# Patient Record
Sex: Female | Born: 1995
Health system: Southern US, Community
[De-identification: ages and names within clinical notes are randomized; demographics above are authoritative.]

## PROBLEM LIST (undated history)

## (undated) DIAGNOSIS — F32A Depression, unspecified: Secondary | ICD-10-CM

## (undated) DIAGNOSIS — F329 Major depressive disorder, single episode, unspecified: Secondary | ICD-10-CM

## (undated) DIAGNOSIS — F419 Anxiety disorder, unspecified: Secondary | ICD-10-CM

## (undated) HISTORY — DX: Anxiety disorder, unspecified: F41.9

## (undated) HISTORY — DX: Major depressive disorder, single episode, unspecified: F32.9

## (undated) HISTORY — DX: Depression, unspecified: F32.A

---
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
## 2016-11-05 ENCOUNTER — Telehealth: Payer: Self-pay

## 2016-11-05 NOTE — Telephone Encounter (Signed)
Pre visit call attempted. Left message for return call. 

## 2016-11-07 ENCOUNTER — Encounter: Payer: Self-pay | Admitting: Family

## 2016-11-07 ENCOUNTER — Ambulatory Visit (INDEPENDENT_AMBULATORY_CARE_PROVIDER_SITE_OTHER): Payer: 59 | Admitting: Family

## 2016-11-07 DIAGNOSIS — F411 Generalized anxiety disorder: Secondary | ICD-10-CM

## 2016-11-07 MED ORDER — ESCITALOPRAM OXALATE 10 MG PO TABS: ORAL_TABLET | ORAL | 0 refills | Status: DC
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
## 2016-11-07 NOTE — Assessment & Plan Note (Signed)
Uncontrolled.  Will give trial of lexapro 10mg. I instructed pt to start 1/2 tablet once daily for 1 week and then increase to a full tablet once daily on week two as tolerated.  We discussed common side effects such as nausea, drowsiness and weight gain.  Also discussed rare but serious side effect of suicide ideation.  She is instructed to discontinue medication go directly to ED if this occurs.  Pt verbalizes understanding.  Plan follow up in 1 month to evaluate progress.     

## 2016-11-07 NOTE — Patient Instructions (Signed)
Please begin lexapro 1/2 tab once daily then increase to a full tab once daily on week two.

## 2016-11-07 NOTE — Progress Notes (Signed)
Pre visit review using our clinic review tool, if applicable. No additional management support is needed unless otherwise documented below in the visit note. 

## 2016-12-05 ENCOUNTER — Encounter: Payer: Self-pay | Admitting: Family

## 2016-12-05 ENCOUNTER — Ambulatory Visit (INDEPENDENT_AMBULATORY_CARE_PROVIDER_SITE_OTHER): Payer: 59 | Admitting: Family

## 2016-12-05 VITALS — BP 113/68 | HR 54 | Temp 98.6°F | Resp 16 | Ht 60.0 in | Wt 124.4 lb

## 2016-12-05 DIAGNOSIS — F419 Anxiety disorder, unspecified: Secondary | ICD-10-CM | POA: Diagnosis not present

## 2016-12-05 MED ORDER — ESCITALOPRAM OXALATE 10 MG PO TABS: ORAL_TABLET | ORAL | 0 refills | Status: DC

## 2016-12-05 NOTE — Patient Instructions (Signed)
Please continue lexapro. Schedule follow up with a provider in [place] after you move. Good luck!

## 2016-12-05 NOTE — Progress Notes (Signed)
   Subjective:    Patient ID: Amanda Villegas, female    DOB: 17-Apr-1996, 21 y.o.   MRN: [id number]  HPI  Ms. Amanda Villegas is a 21 yr old female who presents today for follow up of her anxiety. Last visit we added lexapro. She reports that her anxiety is much improved since she started lexapro.  Denies any recent panic attacks. Feels better able to let things roll off when things happen at work. Crowds are no longer bothering her.  Denies side effects.     Wt Readings from Last 3 Encounters:  12/05/16 124 lb 6.4 oz (56.4 kg)  11/07/16 125 lb 6.4 oz (56.9 kg)  02/27/13 116 lb (52.6 kg) (42 %, Z= -0.20)*   * Growth percentiles are based on CDC 2-20 Years data.      Review of Systems See HPI  Past Medical History:  Diagnosis Date  . Anxiety   . Depression      Social History   Social History  . Marital status: Single    Spouse name: N/A  . Number of children: N/A  . Years of education: N/A   Occupational History  . Not on file.   Social History Main Topics  . Smoking status: Never Smoker  . Smokeless tobacco: Never Used  . Alcohol use No     Comment: 1 drink a month  . Drug use: No  . Sexual activity: No   Other Topics Concern  . 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  Family History  Problem Relation Age of Onset  . Anxiety disorder Cousin   . Depression Mother   . Anxiety disorder Mother   . Anxiety disorder Sister   . Depression Sister   . Rheum arthritis Sister     Allergies  Allergen Reactions  . Shellfish Allergy Swelling    Current Outpatient Prescriptions on File Prior to Visit  Medication Sig Dispense Refill  . escitalopram (LEXAPRO) 10 MG tablet 1/2 tab by mouth once daily for 1 week, then increase to a full tab on week two 30 tablet 0   No current facility-administered medications on file prior to visit.     BP  113/68 (BP Location: Right Arm, Cuff Size: Normal)   Pulse (!) 54   Temp 98.6 F (37 C) (Oral)   Resp 16   Ht 5' (1.524 m)   Wt 124 lb 6.4 oz (56.4 kg)   LMP 11/21/2016   SpO2 97%   BMI 24.30 kg/m       Objective:   Physical Exam  Constitutional: She is oriented to person, place, and time. She appears well-developed and well-nourished.  Cardiovascular: Normal rate, regular rhythm and normal heart sounds.   No murmur heard. Pulmonary/Chest: Effort normal and breath sounds normal. No respiratory distress. She has no wheezes.  Neurological: She is alert and oriented to person, place, and time.  Psychiatric: She has a normal mood and affect. Her behavior is normal. Judgment and thought content normal.          Assessment & Plan:  Anxiety- improved on lexapro. Continue same. She is moving at the end of the month. She plans to establish with a new pcp when she gets to [place] for ongoing follow up.

## 2016-12-12 DIAGNOSIS — H40033 Anatomical narrow angle, bilateral: Secondary | ICD-10-CM | POA: Diagnosis not present

## 2016-12-12 DIAGNOSIS — G44219 Episodic tension-type headache, not intractable: Secondary | ICD-10-CM | POA: Diagnosis not present

## 2017-02-10 ENCOUNTER — Other Ambulatory Visit: Payer: Self-pay | Admitting: Family

## 2017-02-15 MED ORDER — ESCITALOPRAM OXALATE 10 MG PO TABS: ORAL_TABLET | ORAL | 0 refills | Status: DC

## 2017-04-05 ENCOUNTER — Other Ambulatory Visit: Payer: Self-pay | Admitting: Family

## 2017-07-03 DIAGNOSIS — I499 Cardiac arrhythmia, unspecified: Secondary | ICD-10-CM | POA: Diagnosis not present

## 2017-07-03 DIAGNOSIS — R55 Syncope and collapse: Secondary | ICD-10-CM | POA: Diagnosis not present

## 2017-07-03 DIAGNOSIS — R42 Dizziness and giddiness: Secondary | ICD-10-CM | POA: Diagnosis not present

## 2018-01-07 ENCOUNTER — Ambulatory Visit: Payer: 59 | Admitting: Family

## 2018-01-17 ENCOUNTER — Encounter: Payer: Self-pay | Admitting: Family

## 2018-06-01 ENCOUNTER — Other Ambulatory Visit: Payer: Self-pay

## 2018-06-01 ENCOUNTER — Encounter: Payer: Self-pay | Admitting: Emergency Medicine

## 2018-06-01 ENCOUNTER — Emergency Department
Admission: EM | Admit: 2018-06-01 | Discharge: 2018-06-01 | Disposition: A | Discharge status: Home / Self Care | Payer: 59 | Attending: Emergency Medicine | Admitting: Emergency Medicine

## 2018-06-01 DIAGNOSIS — R1030 Lower abdominal pain, unspecified: Secondary | ICD-10-CM | POA: Diagnosis not present

## 2018-06-01 DIAGNOSIS — Z79899 Other long term (current) drug therapy: Secondary | ICD-10-CM | POA: Diagnosis not present

## 2018-06-01 DIAGNOSIS — R109 Unspecified abdominal pain: Secondary | ICD-10-CM | POA: Diagnosis not present

## 2018-06-01 DIAGNOSIS — R1031 Right lower quadrant pain: Secondary | ICD-10-CM | POA: Diagnosis not present

## 2018-06-01 LAB — CBC
HCT: 39 % (ref 36.0–46.0)
Hemoglobin: 13 g/dL (ref 12.0–15.0)
MCH: 30.4 pg (ref 26.0–34.0)
MCHC: 33.3 g/dL (ref 30.0–36.0)
MCV: 91.3 fL (ref 80.0–100.0)
NRBC: 0 % (ref 0.0–0.2)
PLATELETS: 269 10*3/uL (ref 150–400)
RBC: 4.27 MIL/uL (ref 3.87–5.11)
RDW: 12.2 % (ref 11.5–15.5)
WBC: 5.5 10*3/uL (ref 4.0–10.5)

## 2018-06-01 LAB — URINALYSIS, COMPLETE (UACMP) WITH MICROSCOPIC
BILIRUBIN URINE: NEGATIVE
GLUCOSE, UA: NEGATIVE mg/dL
KETONES UR: NEGATIVE mg/dL
LEUKOCYTES UA: NEGATIVE
NITRITE: NEGATIVE
Protein, ur: NEGATIVE mg/dL
Specific Gravity, Urine: 1.006 (ref 1.005–1.030)
pH: 7 (ref 5.0–8.0)

## 2018-06-01 LAB — COMPREHENSIVE METABOLIC PANEL
ALBUMIN: 4.5 g/dL (ref 3.5–5.0)
ALT: 15 U/L (ref 0–44)
ANION GAP: 7 (ref 5–15)
AST: 19 U/L (ref 15–41)
Alkaline Phosphatase: 56 U/L (ref 38–126)
BUN: 9 mg/dL (ref 6–20)
CALCIUM: 9 mg/dL (ref 8.9–10.3)
CO2: 25 mmol/L (ref 22–32)
Chloride: 106 mmol/L (ref 98–111)
Creatinine, Ser: 0.54 mg/dL (ref 0.44–1.00)
GFR calc non Af Amer: >60 mL/min (ref >60)
Glucose, Bld: 94 mg/dL (ref 70–99)
Potassium: 3.9 mmol/L (ref 3.5–5.1)
SODIUM: 138 mmol/L (ref 135–145)
TOTAL PROTEIN: 7.8 g/dL (ref 6.5–8.1)
Total Bilirubin: 0.7 mg/dL (ref 0.3–1.2)

## 2018-06-01 LAB — LIPASE, BLOOD: LIPASE: 35 U/L (ref 11–51)

## 2018-06-01 MED ORDER — ONDANSETRON 4 MG PO TBDP
4.0000 mg | ORAL_TABLET | Freq: Once | ORAL | Status: AC | PRN
  Administered 2018-06-01: 4 mg via ORAL
  Filled 2018-06-01: qty 1

## 2018-06-01 NOTE — ED Triage Notes (Signed)
FIRST NURSE NOTE-here for abdominal pain. NAD.

## 2018-06-01 NOTE — Discharge Instructions (Addendum)
Return to the ER for new, worsening, persistent severe abdominal pain, vomiting, fever, vaginal bleeding or discharge, urinary symptoms, or any other new or worsening symptoms that concern you.

## 2018-06-01 NOTE — ED Provider Notes (Signed)
[facility] Emergency Department Provider Note ____________________________________________   First MD Initiated Contact with Patient 06/01/18 1533     (approximate)  I have reviewed the triage vital signs and the nursing notes.   HISTORY  Chief Complaint Abdominal Pain    HPI Amanda Villegas is a 22 y.o. female with PMH as noted below who presents with right lower abdominal/suprapubic pain, acute onset about 3 hours ago, and now completely resolved.  The patient reports some mild associated nausea.  She denies fever, vomiting or diarrhea, vaginal bleeding, discharge, or urinary symptoms.  She states that she has had ovarian cysts in the past, and thought that the pain might be due to cyst rupture.   Past Medical History:  Diagnosis Date  . Anxiety   . Depression     Patient Active Problem List   Diagnosis Date Noted  . Generalized anxiety disorder 07/28/2014    History reviewed. No pertinent surgical history.  Prior to Admission medications   Medication Sig Start Date End Date Taking? Authorizing Provider  escitalopram (LEXAPRO) 10 MG tablet TAKE 1 TABLET BY MOUTH EVERY DAY 04/09/17   Sandford Craze, NP    Allergies Shellfish allergy  Family History  Problem Relation Age of Onset  . Anxiety disorder Cousin   . Depression Mother   . Anxiety disorder Mother   . Anxiety disorder Sister   . Depression Sister   . Rheum arthritis Sister     Social History Social History   Tobacco Use  . Smoking status: Never Smoker  . Smokeless tobacco: Never Used  Substance Use Topics  . Alcohol use: No    Comment: 1 drink a month  . Drug use: No    Review of Systems  Constitutional: No fever. Eyes: No redness. ENT: No neck pain. Cardiovascular: Denies chest pain. Respiratory: Denies shortness of breath. Gastrointestinal: Positive for nausea.  Genitourinary: Negative for dysuria.  Musculoskeletal: Negative for back pain. Skin: Negative  for rash. Neurological: Negative for headache.   ____________________________________________   PHYSICAL EXAM:  VITAL SIGNS: ED Triage Vitals  Enc Vitals Group     BP 06/01/18 1500 134/80     Pulse Rate 06/01/18 1500 95     Resp 06/01/18 1500 18     Temp 06/01/18 1500 98.3 F (36.8 C)     Temp src --      SpO2 06/01/18 1500 98 %     Weight 06/01/18 1501 112 lb (50.8 kg)     Height 06/01/18 1501 5\' 4"  (1.626 m)     Head Circumference --      Peak Flow --      Pain Score 06/01/18 1501 5     Pain Loc --      Pain Edu? --      Excl. in GC? --     Constitutional: Alert and oriented. Well appearing and in no acute distress. Eyes: Conjunctivae are normal.  Head: Atraumatic. Nose: No congestion/rhinnorhea. Mouth/Throat: Mucous membranes are moist.   Neck: Normal range of motion.  Cardiovascular: Good peripheral circulation. Respiratory: Normal respiratory effort.  Gastrointestinal: Soft and nontender. No distention.  Genitourinary: No CVA tenderness. Musculoskeletal:  Extremities warm and well perfused.  Neurologic:  Normal speech and language. No gross focal neurologic deficits are appreciated.  Skin:  Skin is warm and dry. No rash noted. Psychiatric: Mood and affect are normal. Speech and behavior are normal.  ____________________________________________   LABS (all labs ordered are listed, but only abnormal  results are displayed)  Labs Reviewed  URINALYSIS, COMPLETE (UACMP) WITH MICROSCOPIC - Abnormal; Notable for the following components:      Result Value   Color, Urine STRAW (*)    APPearance CLEAR (*)    Hgb urine dipstick SMALL (*)    Bacteria, UA RARE (*)    All other components within normal limits  LIPASE, BLOOD  COMPREHENSIVE METABOLIC PANEL  CBC  POC URINE PREG, ED    ____________________________________________  EKG   ____________________________________________  RADIOLOGY    ____________________________________________   PROCEDURES  Procedure(s) performed: No  Procedures  Critical Care performed: No ____________________________________________   INITIAL IMPRESSION / ASSESSMENT AND PLAN / ED COURSE  Pertinent labs & imaging results that were available during my care of the patient were reviewed by me and considered in my medical decision making (see chart for details).  22 year old female presents with acute onset of right lower quadrant/suprapubic area pain about 3 hours prior to arrival, which is now completely resolved.  She reports some associated nausea.  There are no other significant associated symptoms.  On exam the patient is well-appearing and her vital signs are normal.  Her abdomen is soft and there is no tenderness currently.  Overall I suspect most likely ruptured ovarian cyst, mittelschmerz, intestinal cramping, or other benign etiology.  Given the resolved symptoms and lack of tenderness there is no evidence of appendicitis or other concerning cause, or indication for imaging.  We will obtain labs and a UA.  If the patient remains asymptomatic and the work-up is unremarkable anticipate discharge home.  ----------------------------------------- 4:46 PM on 06/01/2018 -----------------------------------------  The labs and UA are unremarkable.  The patient continues to be pain-free.  I counseled her on the likely causes of her symptoms and the results of the work-up.  Return precautions given, and she expresses understanding.   ____________________________________________   FINAL CLINICAL IMPRESSION(S) / ED DIAGNOSES  Final diagnoses:  Lower abdominal pain      NEW MEDICATIONS STARTED DURING THIS VISIT:  New Prescriptions   No medications on file     Note:  This document was prepared using Dragon voice  recognition software and may include unintentional dictation errors.    Dionne BucySiadecki, Janvi Ammar, MD 06/01/18 [id number]

## 2018-06-01 NOTE — ED Notes (Signed)
Pt to the er for lower abd pain that felt like menstrual cramps but was increased on the RLQ. Pt states she has a hx of ovarian cysts. Pt states that the pain became worse and then improved. Pt reports some nausea at this time. No acute distress, or vomiting noted,.

## 2018-06-01 NOTE — ED Triage Notes (Signed)
Pt arrives with complaints of right lower quadrant pain. Pt states the pain started Friday "but wasn't bad." Pt states the pain has increased in intensity over the last few days. Pt states she has a HX of ovarian cysts and attributed the pain to a cyst rupturing. Pt's abdomen tender upon palpation.

## 2018-06-03 ENCOUNTER — Other Ambulatory Visit: Payer: Self-pay

## 2018-06-03 ENCOUNTER — Encounter (HOSPITAL_BASED_OUTPATIENT_CLINIC_OR_DEPARTMENT_OTHER): Payer: Self-pay | Admitting: *Deleted

## 2018-06-03 ENCOUNTER — Emergency Department (HOSPITAL_BASED_OUTPATIENT_CLINIC_OR_DEPARTMENT_OTHER)
Admission: EM | Admit: 2018-06-03 | Discharge: 2018-06-03 | Disposition: A | Discharge status: Home / Self Care | Payer: 59 | Attending: Emergency Medicine | Admitting: Emergency Medicine

## 2018-06-03 ENCOUNTER — Emergency Department (HOSPITAL_BASED_OUTPATIENT_CLINIC_OR_DEPARTMENT_OTHER): Payer: 59

## 2018-06-03 DIAGNOSIS — Z79899 Other long term (current) drug therapy: Secondary | ICD-10-CM | POA: Insufficient documentation

## 2018-06-03 DIAGNOSIS — N83291 Other ovarian cyst, right side: Secondary | ICD-10-CM | POA: Diagnosis not present

## 2018-06-03 DIAGNOSIS — N730 Acute parametritis and pelvic cellulitis: Secondary | ICD-10-CM | POA: Diagnosis not present

## 2018-06-03 DIAGNOSIS — R1030 Lower abdominal pain, unspecified: Secondary | ICD-10-CM | POA: Diagnosis present

## 2018-06-03 DIAGNOSIS — N83201 Unspecified ovarian cyst, right side: Secondary | ICD-10-CM | POA: Diagnosis not present

## 2018-06-03 LAB — COMPREHENSIVE METABOLIC PANEL
ALK PHOS: 56 U/L (ref 38–126)
ALT: 21 U/L (ref 0–44)
ANION GAP: 6 (ref 5–15)
AST: 20 U/L (ref 15–41)
Albumin: 4.7 g/dL (ref 3.5–5.0)
BUN: 9 mg/dL (ref 6–20)
CALCIUM: 9.5 mg/dL (ref 8.9–10.3)
CO2: 25 mmol/L (ref 22–32)
Chloride: 106 mmol/L (ref 98–111)
Creatinine, Ser: 0.74 mg/dL (ref 0.44–1.00)
GFR calc Af Amer: >60 mL/min (ref >60)
GFR calc non Af Amer: >60 mL/min (ref >60)
Glucose, Bld: 91 mg/dL (ref 70–99)
Potassium: 3.9 mmol/L (ref 3.5–5.1)
Sodium: 137 mmol/L (ref 135–145)
Total Bilirubin: 0.7 mg/dL (ref 0.3–1.2)
Total Protein: 8 g/dL (ref 6.5–8.1)

## 2018-06-03 LAB — CBC WITH DIFFERENTIAL/PLATELET
Abs Immature Granulocytes: 0.02 10*3/uL (ref 0.00–0.07)
BASOS ABS: 0 10*3/uL (ref 0.0–0.1)
Basophils Relative: 0 %
EOS PCT: 3 %
Eosinophils Absolute: 0.2 10*3/uL (ref 0.0–0.5)
HEMATOCRIT: 40.4 % (ref 36.0–46.0)
Hemoglobin: 13.1 g/dL (ref 12.0–15.0)
Immature Granulocytes: 0 %
Lymphocytes Relative: 26 %
Lymphs Abs: 1.6 10*3/uL (ref 0.7–4.0)
MCH: 29.8 pg (ref 26.0–34.0)
MCHC: 32.4 g/dL (ref 30.0–36.0)
MCV: 92 fL (ref 80.0–100.0)
Monocytes Absolute: 0.3 10*3/uL (ref 0.1–1.0)
Monocytes Relative: 5 %
Neutro Abs: 4 10*3/uL (ref 1.7–7.7)
Neutrophils Relative %: 66 %
Platelets: 275 10*3/uL (ref 150–400)
RBC: 4.39 MIL/uL (ref 3.87–5.11)
RDW: 12.1 % (ref 11.5–15.5)
WBC: 6.1 10*3/uL (ref 4.0–10.5)
nRBC: 0 % (ref 0.0–0.2)

## 2018-06-03 LAB — URINALYSIS, ROUTINE W REFLEX MICROSCOPIC
BILIRUBIN URINE: NEGATIVE
Glucose, UA: NEGATIVE mg/dL
Ketones, ur: NEGATIVE mg/dL
Leukocytes, UA: NEGATIVE
Nitrite: NEGATIVE
Protein, ur: NEGATIVE mg/dL
Specific Gravity, Urine: 1.025 (ref 1.005–1.030)
pH: 6 (ref 5.0–8.0)

## 2018-06-03 LAB — WET PREP, GENITAL
Sperm: NONE SEEN
Trich, Wet Prep: NONE SEEN
Yeast Wet Prep HPF POC: NONE SEEN

## 2018-06-03 LAB — LIPASE, BLOOD: Lipase: 34 U/L (ref 11–51)

## 2018-06-03 LAB — URINALYSIS, MICROSCOPIC (REFLEX)

## 2018-06-03 LAB — PREGNANCY, URINE: PREG TEST UR: NEGATIVE

## 2018-06-03 MED ORDER — SODIUM CHLORIDE 0.9 % IV SOLN
2.0000 g | Freq: Once | INTRAVENOUS | Status: AC
  Administered 2018-06-03: 2 g via INTRAVENOUS
  Filled 2018-06-03: qty 20

## 2018-06-03 MED ORDER — HYDROCODONE-ACETAMINOPHEN 5-325 MG PO TABS: 1.0000 | ORAL_TABLET | Freq: Four times a day (QID) | ORAL | 0 refills | Status: DC | PRN

## 2018-06-03 MED ORDER — ONDANSETRON HCL 4 MG/2ML IJ SOLN
4.0000 mg | Freq: Once | INTRAMUSCULAR | Status: AC
  Administered 2018-06-03: 4 mg via INTRAVENOUS
  Filled 2018-06-03: qty 2

## 2018-06-03 MED ORDER — IOPAMIDOL (ISOVUE-300) INJECTION 61%
100.0000 mL | Freq: Once | INTRAVENOUS | Status: AC | PRN
  Administered 2018-06-03: 100 mL via INTRAVENOUS

## 2018-06-03 MED ORDER — DOXYCYCLINE HYCLATE 100 MG PO CAPS: 100.0000 mg | ORAL_CAPSULE | Freq: Two times a day (BID) | ORAL | 0 refills | Status: AC

## 2018-06-03 MED ORDER — HYDROMORPHONE HCL 1 MG/ML IJ SOLN
0.5000 mg | Freq: Once | INTRAMUSCULAR | Status: AC
  Administered 2018-06-03: 0.5 mg via INTRAVENOUS
  Filled 2018-06-03: qty 1

## 2018-06-03 MED ORDER — SODIUM CHLORIDE 0.9 % IV SOLN
INTRAVENOUS | Status: DC
  Administered 2018-06-03: 14:00:00 via INTRAVENOUS

## 2018-06-03 MED ORDER — METRONIDAZOLE 500 MG PO TABS: 500.0000 mg | ORAL_TABLET | Freq: Two times a day (BID) | ORAL | 0 refills | Status: AC

## 2018-06-03 MED ORDER — NAPROXEN 500 MG PO TABS: 500.0000 mg | ORAL_TABLET | Freq: Two times a day (BID) | ORAL | 0 refills | Status: DC

## 2018-06-03 MED FILL — NAPROXEN 500 MG TABLET: 500 | 7 days supply | Qty: 14 | Fill #0

## 2018-06-03 MED FILL — metroNIDAZOLE 500 MG TABS: 500 | 14 days supply | Qty: 28 | Fill #0

## 2018-06-03 MED FILL — HYDROCODON-APAP 5-325: 5-325 | 2 days supply | Qty: 10 | Fill #0

## 2018-06-03 MED FILL — DOXYCYCLINE HYCLATE 100 MG: 100 | 14 days supply | Qty: 28 | Fill #0

## 2018-06-03 NOTE — Discharge Instructions (Addendum)
Take the antibiotic Flagyl and doxycycline as directed.  Would expect improvement over the next few days.  Take the Naprosyn and the hydrocodone for the pain.  Return for any new or worse symptoms persistent vomiting high fevers.  Make an appointment to follow-up with your primary care doctor.

## 2018-06-03 NOTE — ED Provider Notes (Signed)
[facility] EMERGENCY DEPARTMENT Provider Note   CSN: [id number] Arrival date & time: 06/03/18  1214     History   Chief Complaint Chief Complaint  Patient presents with  . Abdominal Pain    HPI Amanda Villegas is a 22 y.o. female.  Patient seen at the ED at [facility] onset on December 1.  Patient was evaluated there for lower abdominal pain that was mostly right lower quadrant.  She also had abdominal cramping.  Menstrual period was about a week ago.  These cramps were similar to her menstrual cramps however she does not normally have that happen without bleeding.  Patient improved there did not have any imaging done.  And was discharged home with a clinical diagnosis of an ovarian cyst.  Patient returns today with recurrent right lower quadrant abdominal pain that came back later on Monday.  And patient feels as if she is no better and perhaps even worse.     Past Medical History:  Diagnosis Date  . Anxiety   . Depression     Patient Active Problem List   Diagnosis Date Noted  . Generalized anxiety disorder 07/28/2014    History reviewed. No pertinent surgical history.   OB History   None      Home Medications    Prior to Admission medications   Medication Sig Start Date End Date Taking? Authorizing Provider  doxycycline (VIBRAMYCIN) 100 MG capsule Take 1 capsule (100 mg total) by mouth 2 (two) times daily for 14 days. 06/03/18 06/17/18  Vanetta Mulders, MD  escitalopram (LEXAPRO) 10 MG tablet TAKE 1 TABLET BY MOUTH EVERY DAY 04/09/17   Sandford Craze, NP  HYDROcodone-acetaminophen (NORCO/VICODIN) 5-325 MG tablet Take 1-2 tablets by mouth every 6 (six) hours as needed for moderate pain. 06/03/18   Vanetta Mulders, MD  metroNIDAZOLE (FLAGYL) 500 MG tablet Take 1 tablet (500 mg total) by mouth 2 (two) times daily for 14 days. 06/03/18 06/17/18  Vanetta Mulders, MD  naproxen (NAPROSYN) 500 MG tablet Take 1 tablet (500 mg total) by mouth 2 (two) times  daily. 06/03/18   Vanetta Mulders, MD    Family History Family History  Problem Relation Age of Onset  . Anxiety disorder Cousin   . Depression Mother   . Anxiety disorder Mother   . Anxiety disorder Sister   . Depression Sister   . Rheum arthritis Sister     Social History Social History   Tobacco Use  . Smoking status: Never Smoker  . Smokeless tobacco: Never Used  Substance Use Topics  . Alcohol use: No    Comment: 1 drink a month  . Drug use: No     Allergies   Shellfish allergy   Review of Systems Review of Systems  Constitutional: Negative for fever.  HENT: Negative for congestion.   Eyes: Negative for redness.  Respiratory: Negative for shortness of breath.   Cardiovascular: Negative for chest pain.  Gastrointestinal: Positive for abdominal pain and nausea. Negative for diarrhea and vomiting.  Genitourinary: Positive for vaginal discharge. Negative for dysuria and vaginal bleeding.  Musculoskeletal: Negative for back pain.  Skin: Negative for rash.  Neurological: Negative for headaches.  Hematological: Does not bruise/bleed easily.  Psychiatric/Behavioral: Negative for confusion.     Physical Exam Updated Vital Signs BP (!) 103/56 (BP Location: Right Arm)   Pulse 78   Temp 98.3 F (36.8 C) (Oral)   Resp 16   Ht 1.626 m (5\' 4" )   Wt 50.8 kg  LMP 05/23/2018   SpO2 100%   BMI 19.22 kg/m   Physical Exam  Constitutional: She is oriented to person, place, and time. She appears well-developed and well-nourished. No distress.  HENT:  Head: Normocephalic and atraumatic.  Mouth/Throat: Oropharynx is clear and moist.  Eyes: Pupils are equal, round, and reactive to light. Conjunctivae and EOM are normal.  Neck: Normal range of motion. Neck supple.  Cardiovascular: Normal rate, regular rhythm and normal heart sounds.  Pulmonary/Chest: Effort normal and breath sounds normal.  Abdominal: Soft. Bowel sounds are normal.  Genitourinary: Vaginal discharge  found.  Genitourinary Comments: Normal external genitalia.  Whitish-brown vaginal discharge positive cervical motion tenderness positive urine tenderness positive right adnexal area tenderness.  Musculoskeletal: Normal range of motion. She exhibits no edema.  Neurological: She is alert and oriented to person, place, and time. No cranial nerve deficit or sensory deficit. She exhibits normal muscle tone. Coordination normal.  Skin: Skin is warm. No rash noted.  Nursing note and vitals reviewed.    ED Treatments / Results  Labs (all labs ordered are listed, but only abnormal results are displayed) Labs Reviewed  WET PREP, GENITAL - Abnormal; Notable for the following components:      Result Value   Clue Cells Wet Prep HPF POC PRESENT (*)    WBC, Wet Prep HPF POC MANY (*)    All other components within normal limits  URINALYSIS, ROUTINE W REFLEX MICROSCOPIC - Abnormal; Notable for the following components:   Hgb urine dipstick SMALL (*)    All other components within normal limits  URINALYSIS, MICROSCOPIC (REFLEX) - Abnormal; Notable for the following components:   Bacteria, UA FEW (*)    All other components within normal limits  PREGNANCY, URINE  COMPREHENSIVE METABOLIC PANEL  LIPASE, BLOOD  CBC WITH DIFFERENTIAL/PLATELET  RPR  HIV ANTIBODY (ROUTINE TESTING W REFLEX)  GC/CHLAMYDIA PROBE AMP ([place]) NOT AT [facility]    EKG None  Radiology Ct Abdomen Pelvis W Contrast  Result Date: 06/03/2018 CLINICAL DATA:  Acute right lower quadrant abdominal pain. EXAM: CT ABDOMEN AND PELVIS WITH CONTRAST TECHNIQUE: Multidetector CT imaging of the abdomen and pelvis was performed using the standard protocol following bolus administration of intravenous contrast. CONTRAST:  ISOVUE-300 IOPAMIDOL (ISOVUE-300) INJECTION 61% COMPARISON:  None. FINDINGS: Lower chest: No acute abnormality. Hepatobiliary: No focal liver abnormality is seen. No gallstones, gallbladder wall thickening, or biliary  dilatation. Pancreas: Unremarkable. No pancreatic ductal dilatation or surrounding inflammatory changes. Spleen: Normal in size without focal abnormality. Adrenals/Urinary Tract: Adrenal glands are unremarkable. Kidneys are normal, without renal calculi, focal lesion, or hydronephrosis. Bladder is unremarkable. Stomach/Bowel: Stomach is within normal limits. Appendix appears normal. No evidence of bowel wall thickening, distention, or inflammatory changes. Vascular/Lymphatic: No significant vascular findings are present. No enlarged abdominal or pelvic lymph nodes. Reproductive: Uterus and left adnexal region are unremarkable. 2.2 cm right ovarian cyst is noted. Small amount of free fluid is noted in the right adnexal region which may represent physiologic fluid or possibly be secondary to ovarian cyst rupture. Other: No significant hernia is noted. Musculoskeletal: No acute or significant osseous findings. IMPRESSION: 2.2 cm right ovarian cyst is noted. Small amount of fluid is noted in the right adnexal region which may represent physiologic fluid or possibly be secondary to ovarian cyst rupture. Pelvic ultrasound may be performed further evaluation given the history of right lower quadrant pain. No other abnormality seen in the abdomen or pelvis. Electronically Signed   By: Roque Lias  Montez HagemanJr, M.D.   On: 06/03/2018 14:54    Procedures Procedures (including critical care time)  Medications Ordered in ED Medications  0.9 %  sodium chloride infusion ( Intravenous New Bag/Given 06/03/18 1423)  cefTRIAXone (ROCEPHIN) 2 g in sodium chloride 0.9 % 100 mL IVPB (2 g Intravenous New Bag/Given 06/03/18 1546)  ondansetron (ZOFRAN) injection 4 mg (4 mg Intravenous Given 06/03/18 1424)  HYDROmorphone (DILAUDID) injection 0.5 mg (0.5 mg Intravenous Given 06/03/18 1423)  iopamidol (ISOVUE-300) 61 % injection 100 mL (100 mLs Intravenous Contrast Given 06/03/18 1434)     Initial Impression / Assessment and Plan / ED Course   I have reviewed the triage vital signs and the nursing notes.  Pertinent labs & imaging results that were available during my care of the patient were reviewed by me and considered in my medical decision making (see chart for details).     CT scan shows evidence of a right ovarian cyst.  The patient had vaginal discharge so pelvic was done.  More consistent with PID.  Patient received 2 g of Rocephin here will be continued on a 2-week course of doxycycline and Flagyl.  Formal cultures pending.  Patient will also be treated with Naprosyn and hydrocodone for the pain.  Patient is no longer taking Lexapro so no contraindication to taking Naprosyn.  Pregnancy test was negative.  Wet prep  finger shows a few clue cells.    Final Clinical Impressions(s) / ED Diagnoses   Final diagnoses:  Cyst of right ovary  PID (acute pelvic inflammatory disease)    ED Discharge Orders         Ordered    doxycycline (VIBRAMYCIN) 100 MG capsule  2 times daily     06/03/18 1611    metroNIDAZOLE (FLAGYL) 500 MG tablet  2 times daily     06/03/18 1611    naproxen (NAPROSYN) 500 MG tablet  2 times daily     06/03/18 1611    HYDROcodone-acetaminophen (NORCO/VICODIN) 5-325 MG tablet  Every 6 hours PRN     06/03/18 1611           Vanetta MuldersZackowski, Ivey Cina, MD 06/03/18 1620

## 2018-06-03 NOTE — ED Triage Notes (Signed)
Right lower quadrant pain since Monday. She was seen at [facility] ED for same pain 2 days ago for ovarian cyst pain. Pain is no better.

## 2018-06-03 NOTE — ED Notes (Signed)
ED Provider at bedside. 

## 2018-06-04 LAB — RPR: RPR Ser Ql: NONREACTIVE

## 2018-06-04 LAB — GC/CHLAMYDIA PROBE AMP (~~LOC~~) NOT AT ARMC
Chlamydia: NEGATIVE
Neisseria Gonorrhea: NEGATIVE

## 2018-06-04 LAB — HIV ANTIBODY (ROUTINE TESTING W REFLEX): HIV Screen 4th Generation wRfx: NONREACTIVE

## 2018-12-01 ENCOUNTER — Inpatient Hospital Stay: Admission: RE | Admit: 2018-12-01 | Payer: 59 | Source: Ambulatory Visit

## 2019-02-09 ENCOUNTER — Emergency Department (HOSPITAL_BASED_OUTPATIENT_CLINIC_OR_DEPARTMENT_OTHER)
Admission: EM | Admit: 2019-02-09 | Discharge: 2019-02-09 | Disposition: A | Discharge status: Home / Self Care | Attending: Emergency Medicine | Admitting: Emergency Medicine

## 2019-02-09 ENCOUNTER — Emergency Department (HOSPITAL_BASED_OUTPATIENT_CLINIC_OR_DEPARTMENT_OTHER)

## 2019-02-09 ENCOUNTER — Encounter (HOSPITAL_BASED_OUTPATIENT_CLINIC_OR_DEPARTMENT_OTHER): Payer: Self-pay | Admitting: *Deleted

## 2019-02-09 ENCOUNTER — Other Ambulatory Visit: Payer: Self-pay

## 2019-02-09 DIAGNOSIS — S0990XA Unspecified injury of head, initial encounter: Secondary | ICD-10-CM | POA: Diagnosis present

## 2019-02-09 DIAGNOSIS — S060X0A Concussion without loss of consciousness, initial encounter: Secondary | ICD-10-CM | POA: Diagnosis not present

## 2019-02-09 DIAGNOSIS — Y939 Activity, unspecified: Secondary | ICD-10-CM | POA: Insufficient documentation

## 2019-02-09 DIAGNOSIS — Y990 Civilian activity done for income or pay: Secondary | ICD-10-CM | POA: Insufficient documentation

## 2019-02-09 DIAGNOSIS — Z79899 Other long term (current) drug therapy: Secondary | ICD-10-CM | POA: Insufficient documentation

## 2019-02-09 DIAGNOSIS — Z791 Long term (current) use of non-steroidal anti-inflammatories (NSAID): Secondary | ICD-10-CM | POA: Insufficient documentation

## 2019-02-09 DIAGNOSIS — W228XXA Striking against or struck by other objects, initial encounter: Secondary | ICD-10-CM | POA: Insufficient documentation

## 2019-02-09 DIAGNOSIS — Y9259 Other trade areas as the place of occurrence of the external cause: Secondary | ICD-10-CM | POA: Insufficient documentation

## 2019-02-09 MED ORDER — ONDANSETRON HCL 4 MG PO TABS: 4.0000 mg | ORAL_TABLET | Freq: Four times a day (QID) | ORAL | 0 refills | Status: DC

## 2019-02-09 NOTE — Discharge Instructions (Signed)
Take Zofran every 6 hours as needed for nausea or vomiting.  Take ibuprofen or Tylenol as prescribed at the counter, as needed for headache.  Please follow-up with your doctor or Dr. Tamala Julian as needed if your symptoms are not improving.  Try to avoid focused activities such as looking at your phone, tablet, computer.  Try to get plenty of rest and drink plenty of fluids.  Please return to the emergency department if he develop any new or worsening symptoms

## 2019-02-09 NOTE — ED Provider Notes (Signed)
[facility] EMERGENCY DEPARTMENT Provider Note   CSN: [id number] Arrival date & time: 02/09/19  1553    History   Chief Complaint Chief Complaint  Patient presents with  . Head Injury    HPI Amanda Villegas is a 23 y.o. female with history of anxiety, depression who presents for evaluation following head trauma.  Patient reports she was at work when she was hit by the bathroom door.  She reports her vision went a little black, but she did not lose consciousness.  She vomited once about 10 minutes later.  She has persistent headache and some mild nausea.  She was seen at urgent care and was sent here for CT.  She denies any vision changes, recurrent vomiting, numbness or tingling.     HPI  Past Medical History:  Diagnosis Date  . Anxiety   . Depression     Patient Active Problem List   Diagnosis Date Noted  . Generalized anxiety disorder 07/28/2014    History reviewed. No pertinent surgical history.   OB History   No obstetric history on file.      Home Medications    Prior to Admission medications   Medication Sig Start Date End Date Taking? Authorizing Provider  escitalopram (LEXAPRO) 10 MG tablet TAKE 1 TABLET BY MOUTH EVERY DAY 04/09/17   Sandford Craze'Sullivan, Melissa, NP  HYDROcodone-acetaminophen (NORCO/VICODIN) 5-325 MG tablet Take 1-2 tablets by mouth every 6 (six) hours as needed for moderate pain. 06/03/18   Vanetta MuldersZackowski, Scott, MD  naproxen (NAPROSYN) 500 MG tablet Take 1 tablet (500 mg total) by mouth 2 (two) times daily. 06/03/18   Vanetta MuldersZackowski, Scott, MD  ondansetron (ZOFRAN) 4 MG tablet Take 1 tablet (4 mg total) by mouth every 6 (six) hours. 02/09/19   Emi HolesLaw, Sarahbeth Cashin M, PA-C    Family History Family History  Problem Relation Age of Onset  . Anxiety disorder Cousin   . Depression Mother   . Anxiety disorder Mother   . Anxiety disorder Sister   . Depression Sister   . Rheum arthritis Sister     Social History Social History   Tobacco Use  . Smoking  status: Never Smoker  . Smokeless tobacco: Never Used  Substance Use Topics  . Alcohol use: No    Comment: 1 drink a month  . Drug use: No     Allergies   Shellfish allergy   Review of Systems Review of Systems  Constitutional: Negative for chills and fever.  HENT: Negative for facial swelling and sore throat.   Eyes: Negative for visual disturbance.  Respiratory: Negative for shortness of breath.   Cardiovascular: Negative for chest pain.  Gastrointestinal: Negative for abdominal pain, nausea and vomiting.  Genitourinary: Negative for dysuria.  Musculoskeletal: Negative for back pain.  Skin: Negative for rash and wound.  Neurological: Positive for headaches. Negative for syncope.  Psychiatric/Behavioral: The patient is not nervous/anxious.      Physical Exam Updated Vital Signs BP 107/72 (BP Location: Right Arm)   Pulse 77   Temp 98.7 F (37.1 C) (Oral)   Resp 14   Ht 5\' 3"  (1.6 m)   Wt 55.3 kg   LMP 01/29/2019 (Approximate)   SpO2 100%   BMI 21.61 kg/m   Physical Exam Vitals signs and nursing note reviewed.  Constitutional:      General: She is not in acute distress.    Appearance: She is well-developed. She is not diaphoretic.  HENT:     Head: Normocephalic and atraumatic.  Right Ear: Tympanic membrane normal.     Left Ear: Tympanic membrane normal.     Mouth/Throat:     Pharynx: No oropharyngeal exudate.  Eyes:     General: No scleral icterus.       Right eye: No discharge.        Left eye: No discharge.     Extraocular Movements: Extraocular movements intact.     Conjunctiva/sclera: Conjunctivae normal.     Pupils: Pupils are equal, round, and reactive to light.  Neck:     Musculoskeletal: Normal range of motion and neck supple.     Thyroid: No thyromegaly.  Cardiovascular:     Rate and Rhythm: Normal rate and regular rhythm.     Heart sounds: Normal heart sounds. No murmur. No friction rub. No gallop.   Pulmonary:     Effort: Pulmonary  effort is normal. No respiratory distress.     Breath sounds: Normal breath sounds. No stridor. No wheezing or rales.  Abdominal:     General: Bowel sounds are normal. There is no distension.     Palpations: Abdomen is soft.     Tenderness: There is no abdominal tenderness. There is no guarding or rebound.  Lymphadenopathy:     Cervical: No cervical adenopathy.  Skin:    General: Skin is warm and dry.     Coloration: Skin is not pale.     Findings: No rash.  Neurological:     Mental Status: She is alert.     Coordination: Coordination normal.     Comments: CN 3-12 intact; normal sensation throughout; 5/5 strength in all 4 extremities; equal bilateral grip strength      ED Treatments / Results  Labs (all labs ordered are listed, but only abnormal results are displayed) Labs Reviewed - No data to display  EKG None  Radiology Ct Head Wo Contrast  Result Date: 02/09/2019 CLINICAL DATA:  Head trauma. EXAM: CT HEAD WITHOUT CONTRAST TECHNIQUE: Contiguous axial images were obtained from the base of the skull through the vertex without intravenous contrast. COMPARISON:  None. FINDINGS: Brain: No evidence of acute infarction, hemorrhage, hydrocephalus, extra-axial collection or mass lesion/mass effect. Vascular: No hyperdense vessel or unexpected calcification. Skull: Normal. Negative for fracture or focal lesion. Mild right frontal scalp swelling is noted. Sinuses/Orbits: No acute finding. Other: None. IMPRESSION: 1. No acute intracranial abnormality. 2. Mild right frontal scalp swelling without evidence of a displaced fracture. Electronically Signed   By: Constance Holster M.D.   On: 02/09/2019 17:31    Procedures Procedures (including critical care time)  Medications Ordered in ED Medications - No data to display   Initial Impression / Assessment and Plan / ED Course  I have reviewed the triage vital signs and the nursing notes.  Pertinent labs & imaging results that were  available during my care of the patient were reviewed by me and considered in my medical decision making (see chart for details).        Patient presenting following head injury with subsequent episode of vomiting.  CT head ordered to rule out hemorrhage and is negative.  Suspect concussion.  Patient with normal neuro exam.  Concussion precautions discussed.  Follow-up to PCP or concussion clinic.  Advised ibuprofen, Tylenol as needed for headache.  Will discharge home with Zofran for nausea.  Rest and hydration discussed.  Patient advised to avoid looking at screens or reading.  Return precautions discussed.  Patient understands and agrees with plan.  Patient vitals stable  throughout ED course and discharged in satisfactory condition.  Final Clinical Impressions(s) / ED Diagnoses   Final diagnoses:  Concussion without loss of consciousness, initial encounter    ED Discharge Orders         Ordered    ondansetron (ZOFRAN) 4 MG tablet  Every 6 hours     02/09/19 1752           Emi HolesLaw, Sayan Aldava M, PA-C 02/09/19 1811    Vanetta MuldersZackowski, Scott, MD 02/14/19 [phone]

## 2019-02-09 NOTE — ED Triage Notes (Signed)
Head in head w door  Vomited x 1 has red line to rt side of forehead w swelling,  Denies loc

## 2019-02-09 NOTE — ED Notes (Signed)
Ice pack given to pt.

## 2019-02-16 ENCOUNTER — Telehealth: Payer: Self-pay

## 2019-02-16 NOTE — Telephone Encounter (Signed)
Spoke with patient. She suffered a head injury on 02/09/2019. Was walking out a bathroom door when someone was walking in and the door hit patient in the head. Saw stars. No LOC. No history. Has been feeling dizzy, fatigued, and having migraines. 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 Patient scheduled for virtual visit on Wednesday.

## 2019-02-18 ENCOUNTER — Ambulatory Visit (INDEPENDENT_AMBULATORY_CARE_PROVIDER_SITE_OTHER): Payer: 59 | Admitting: Family Medicine

## 2019-02-18 ENCOUNTER — Encounter: Payer: Self-pay | Admitting: Family Medicine

## 2019-02-18 NOTE — Progress Notes (Signed)
No show for virtual visit  

## 2019-07-13 ENCOUNTER — Other Ambulatory Visit: Payer: Self-pay

## 2019-07-13 ENCOUNTER — Emergency Department (INDEPENDENT_AMBULATORY_CARE_PROVIDER_SITE_OTHER)
Admission: EM | Admit: 2019-07-13 | Discharge: 2019-07-13 | Disposition: A | Discharge status: Home Health | Payer: 59 | Source: Home / Self Care

## 2019-07-13 DIAGNOSIS — R21 Rash and other nonspecific skin eruption: Secondary | ICD-10-CM

## 2019-07-13 DIAGNOSIS — T7840XA Allergy, unspecified, initial encounter: Secondary | ICD-10-CM | POA: Diagnosis not present

## 2019-07-13 LAB — POC SARS CORONAVIRUS 2 AG -  ED: SARS Coronavirus 2 Ag: NEGATIVE

## 2019-07-13 MED ORDER — METHYLPREDNISOLONE SODIUM SUCC 125 MG IJ SOLR
62.5000 mg | Freq: Once | INTRAMUSCULAR | Status: AC
  Administered 2019-07-13: 62.5 mg via INTRAMUSCULAR

## 2019-07-13 MED ORDER — METHYLPREDNISOLONE 4 MG PO TABS: ORAL_TABLET | ORAL | 0 refills | Status: AC

## 2019-07-13 MED ORDER — LEVOCETIRIZINE DIHYDROCHLORIDE 5 MG PO TABS: 2.5000 mg | ORAL_TABLET | Freq: Every day | ORAL | 0 refills | Status: AC | PRN

## 2019-07-13 NOTE — ED Triage Notes (Signed)
Pt presents to clinic with onset of symptoms last week nausea and vomiting. Last night pt developed a temp of 100F. No otc medications taken. Pt also complaining of sore throat. Of note, family members were diagnosed with covid19. Pt has been self quarantined.

## 2019-07-13 NOTE — Discharge Instructions (Signed)
Take Levocetirizine 2.5 mg once daily at bedtime for at least 14 days to resolve allergic reaction and skin rash.  Start medrol prescription tomorrow as you received a dose already today in the office. Take medication as directed and complete taper.  Your COVID 19 results will be available in 48-72 hours. Negative results are immediately resulted to Mychart. All positive results are communicated with a phone call from our office.

## 2019-07-13 NOTE — ED Provider Notes (Signed)
[facility]    CSN: [id number] Arrival date & time: 07/13/19  1024      History   Chief Complaint Chief Complaint  Patient presents with  . Sore Throat  . Generalized Body Aches  . Headache    HPI Amanda Villegas is a 24 y.o. female.   HPI  Covid-19 testing  Brother in law tested positive after Christmas. She had direct contact with brother in law. She developed symptoms over 7 days ago which have included mild fever, sore throat, and nausea with vomiting that has since resolved.  Rash  Hive like rash on arms and neck over the last few days.  Appears to be getting worse. Itching is persistent. Took OTC antihistamine without improvement of itching or rash. No recent changes detergents, lotions, cosmetic, pets, or changes in diet. Past Medical History:  Diagnosis Date  . Anxiety   . Depression     Patient Active Problem List   Diagnosis Date Noted  . Generalized anxiety disorder 07/28/2014    No past surgical history on file.  OB History   No obstetric history on file.      Home Medications    Prior to Admission medications   Not on File    Family History Family History  Problem Relation Age of Onset  . Anxiety disorder Cousin   . Depression Mother   . Anxiety disorder Mother   . Anxiety disorder Sister   . Depression Sister   . Rheum arthritis Sister     Social History Social History   Tobacco Use  . Smoking status: Never Smoker  . Smokeless tobacco: Never Used  Substance Use Topics  . Alcohol use: No    Comment: 1 drink a month  . Drug use: No     Allergies   Shellfish allergy   Review of Systems Review of Systems Pertinent negatives listed in HPI  Physical Exam Triage Vital Signs ED Triage Vitals  Enc Vitals Group     BP 07/13/19 1107 123/75     Pulse Rate 07/13/19 1107 85     Resp 07/13/19 1107 20     Temp 07/13/19 1107 98.4 F (36.9 C)     Temp Source 07/13/19 1107 Oral     SpO2 07/13/19 1107 98 %     Weight  07/13/19 1108 130 lb (59 kg)     Height 07/13/19 1108 5\' 4"  (1.626 m)     Head Circumference --      Peak Flow --      Pain Score 07/13/19 1108 0     Pain Loc --      Pain Edu? --      Excl. in GC? --    No data found.  Updated Vital Signs BP 123/75 (BP Location: Right Arm)   Pulse 85   Temp 98.4 F (36.9 C) (Oral)   Resp 20   Ht 5\' 4"  (1.626 m)   Wt 130 lb (59 kg)   LMP 07/13/2019 (Exact Date)   SpO2 98%   BMI 22.31 kg/m   Visual Acuity Right Eye Distance:   Left Eye Distance:   Bilateral Distance:    Right Eye Near:   Left Eye Near:    Bilateral Near:     Physical Exam General appearance: alert, well developed, well nourished, cooperative and in no distress Head: Normocephalic, without obvious abnormality, atraumatic Respiratory: Respirations even and unlabored, normal respiratory rate Heart: rate and rhythm normal. Abdomen: BS +, no  distention, no rebound tenderness, or no mass Extremities: No gross deformities Skin: Positive skin rash Psych: Appropriate mood and affect. Neurologic: Alert, oriented to person, place, and time, thought content appropriate. UC Treatments / Results  Labs (all labs ordered are listed, but only abnormal results are displayed) Labs Reviewed - No data to display  EKG   Radiology No results found.  Procedures Procedures (including critical care time)  Medications Ordered in UC Medications  methylPREDNISolone sodium succinate (SOLU-MEDROL) 125 mg/2 mL injection 62.5 mg (62.5 mg Intramuscular Given 07/13/19 1208)    Initial Impression / Assessment and Plan / UC Course  I have reviewed the triage vital signs and the nursing notes.  Pertinent labs & imaging results that were available during my care of the patient were reviewed by me and considered in my medical decision making (see chart for details).   Allergic reaction causing a urticaria type rash. Solumedrol administered IM in clinic. Trial medrol taper for rash. Start  Levocetirizine daily at bedtime. If rash worsens or doesn't improve, return for follow-up. Covid-19 testing pending related to recent exposure.  COVID-19testing pending. Final Clinical Impressions(s) / UC Diagnoses   Final diagnoses:  Allergic reaction, initial encounter  Rash and nonspecific skin eruption     Discharge Instructions     Take Levocetirizine 2.5 mg once daily at bedtime for at least 14 days to resolve allergic reaction and skin rash.  Start medrol prescription tomorrow as you received a dose already today in the office. Take medication as directed and complete taper.  Your COVID 19 results will be available in 48-72 hours. Negative results are immediately resulted to Mychart. All positive results are communicated with a phone call from our office.     ED Prescriptions    Medication Sig Dispense Auth. Provider   methylPREDNISolone (MEDROL) 4 MG tablet Take 3 pills  x 3 days, take 2 pills x 2 days, take 1 pill x 2 16 tablet Scot Jun, FNP   levocetirizine (XYZAL) 5 MG tablet Take 0.5 tablets (2.5 mg total) by mouth daily as needed for allergies. 30 tablet Scot Jun, FNP     PDMP not reviewed this encounter.   Scot Jun, [place] 07/15/19 [phone]

## 2019-07-19 ENCOUNTER — Telehealth: Payer: Self-pay

## 2019-07-19 DIAGNOSIS — Z9189 Other specified personal risk factors, not elsewhere classified: Secondary | ICD-10-CM

## 2019-07-19 NOTE — ED Notes (Signed)
PT RETURNED 07/19/19 FOR SEND OUT COVID TEST WHICH WAS NOT DONE ORIGINALLY AT 07/13/19 VISIT. NEW WORK NOTE GIVEN.

## 2019-07-19 NOTE — Telephone Encounter (Signed)
Pt called to get send out covid results to give to work. Pt informed that send out doesn't seem to have been done. Coming back in for nurse swab for send out later today.

## 2019-07-21 LAB — NOVEL CORONAVIRUS, NAA: SARS-CoV-2, NAA: NOT DETECTED

## 2020-12-22 IMAGING — CT CT HEAD WITHOUT CONTRAST
3 series · 16 of 47 positions shown, 19 images · non-contrast
Comparison: None.

CLINICAL DATA: Head trauma.

EXAM:
CT HEAD WITHOUT CONTRAST
TECHNIQUE: Contiguous axial images were obtained from the base of the skull
through the vertex without intravenous contrast.

[Series 2: head wo · axial · 0.42mm/px · z∈[-110,+24]mm · 10 of 33 slices shown, 13 images]
[im 3/33  brain]
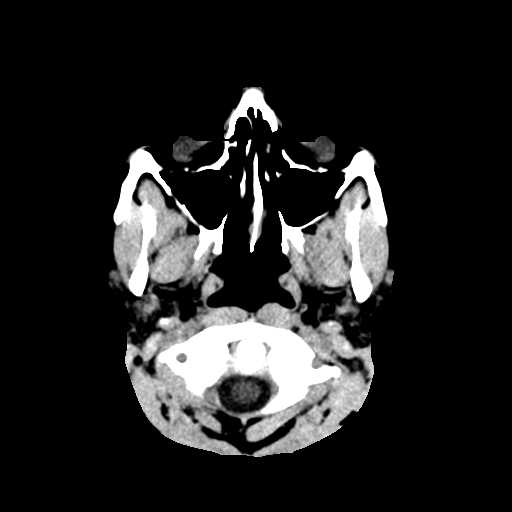
[im 3/33  bone]
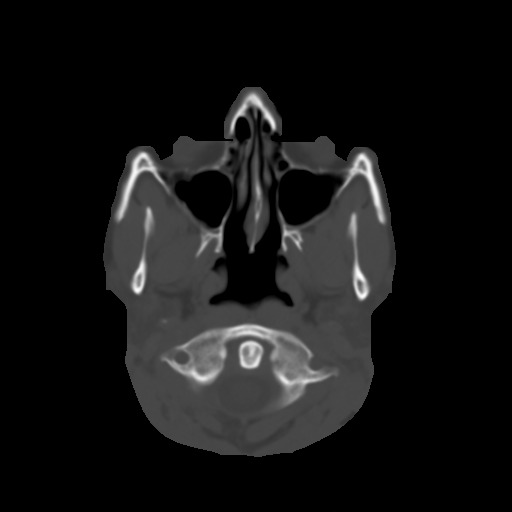
[im 6/33  brain]
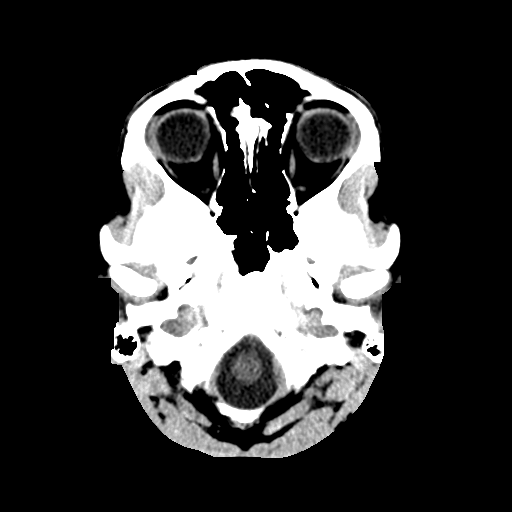
[im 9/33  brain]
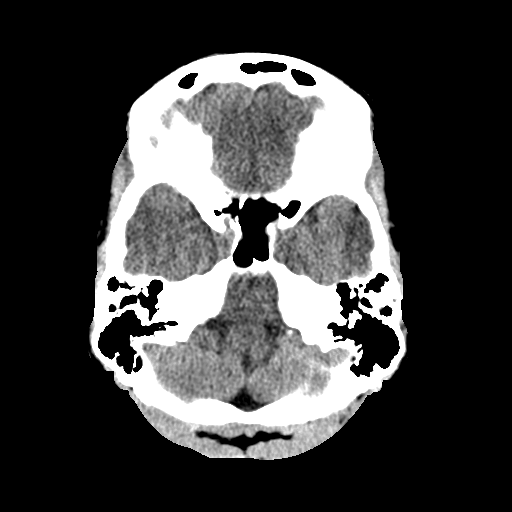
[im 12/33  brain]
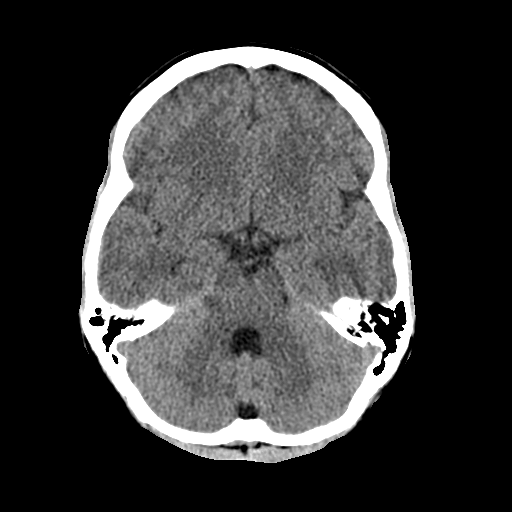
[im 15/33  brain]
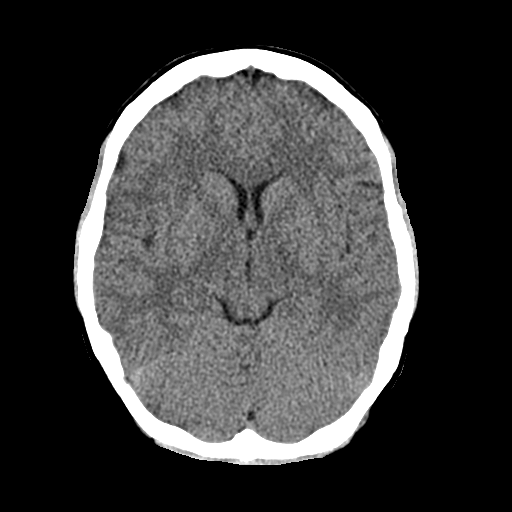
[im 15/33  bone]
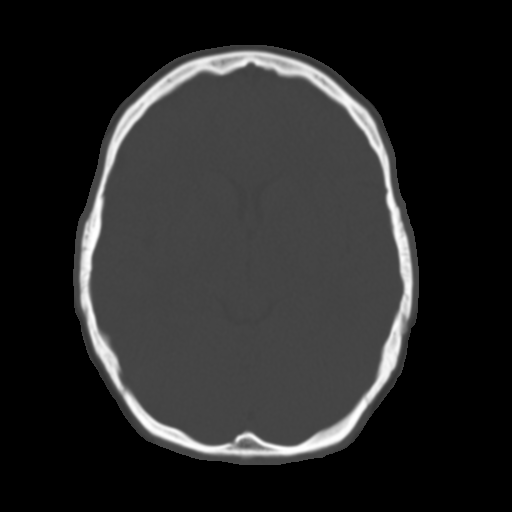
[im 18/33  brain]
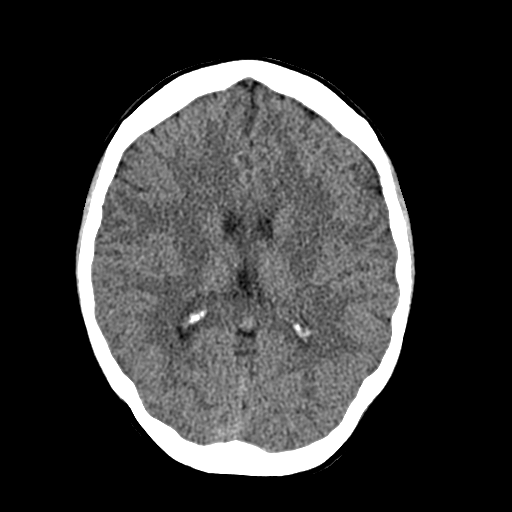
[im 21/33  brain]
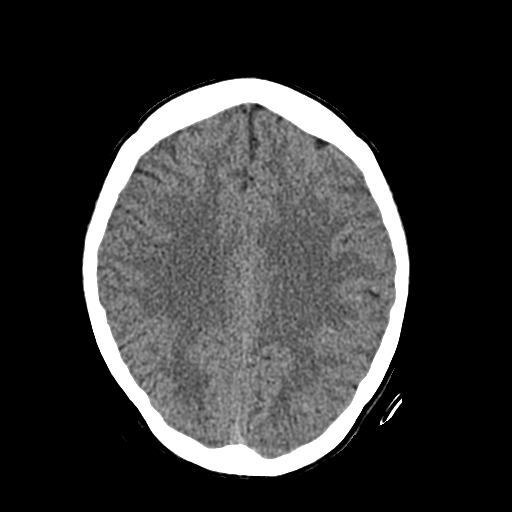
[im 25/33  brain]
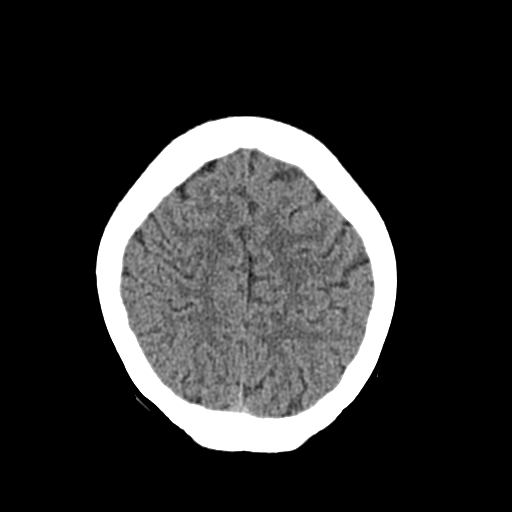
[im 27/33  brain]
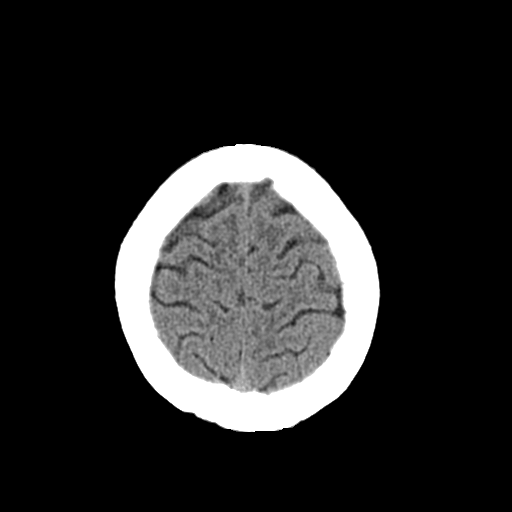
[im 27/33  bone]
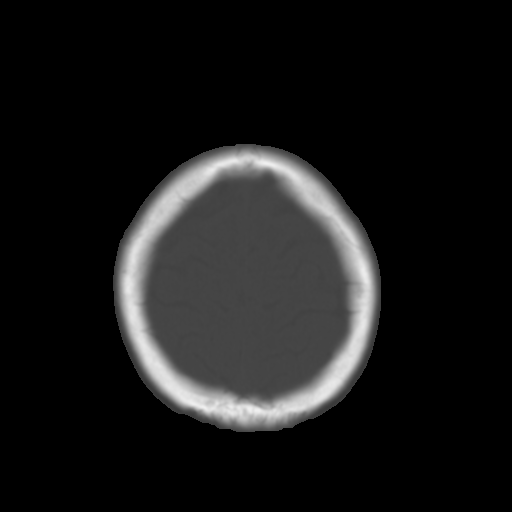
[im 30/33  brain]
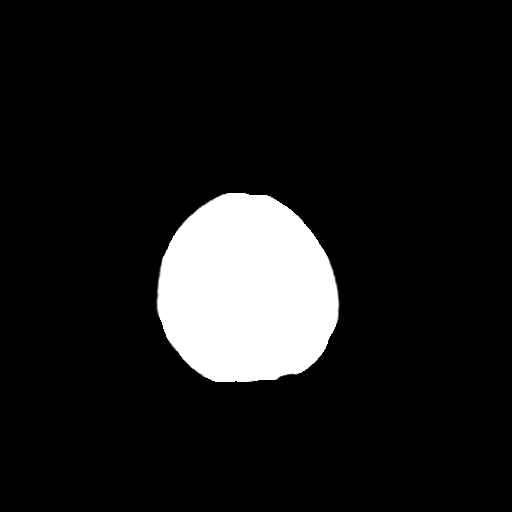

[Series 4: cor soft · coronal · 0.34mm/px · 3 of 68 slices shown]
[im 23/68  brain]
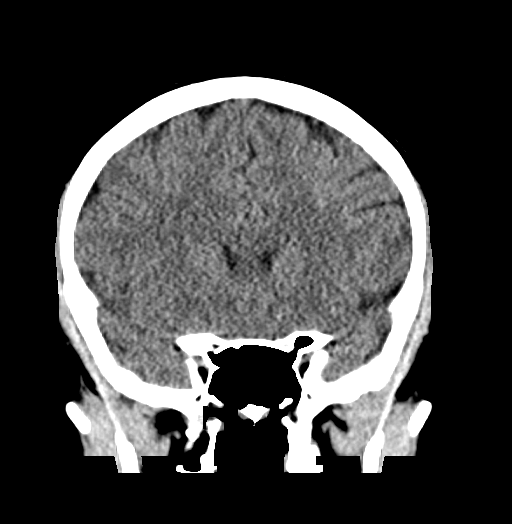
[im 30/68  brain]
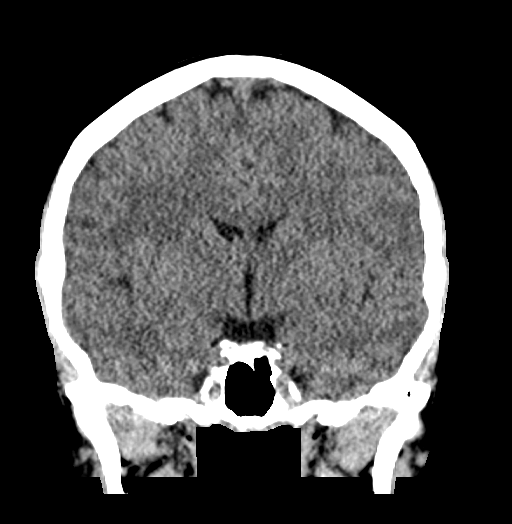
[im 38/68  brain]
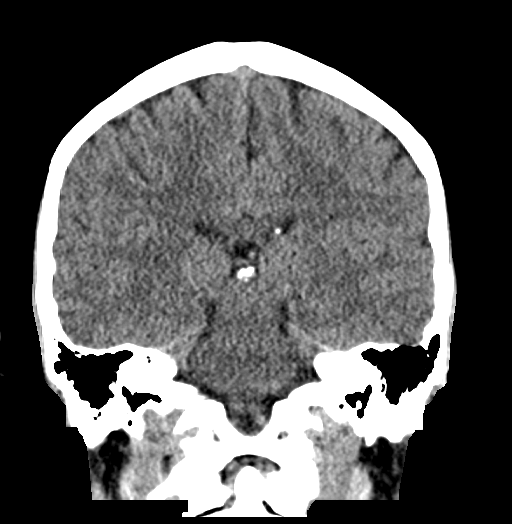

[Series 5: sag soft · sagittal · 0.34mm/px · 3 of 55 slices shown]
[im 19/55  brain]
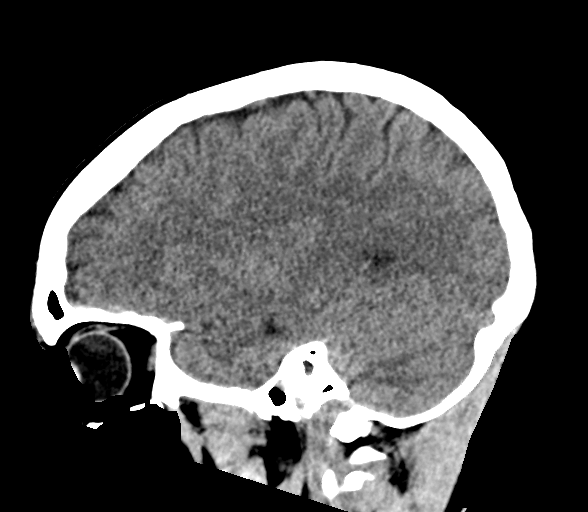
[im 28/55  brain]
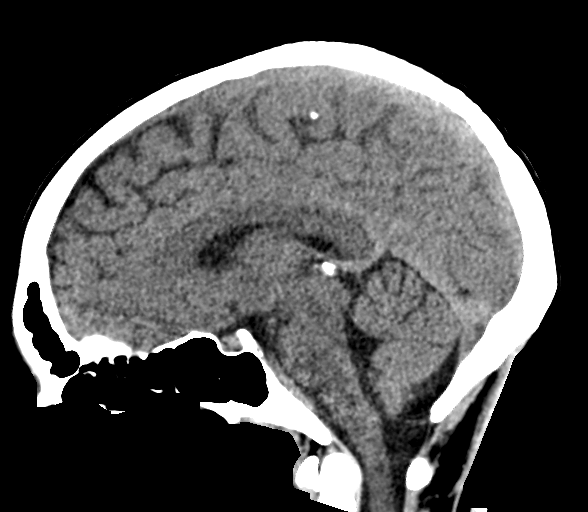
[im 37/55  brain]
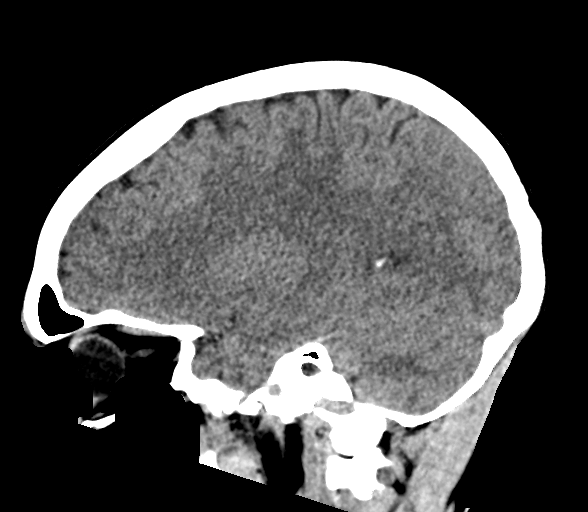

[16 of 47 positions shown; findings below may reference images not displayed]

FINDINGS: Brain: No evidence of acute infarction, hemorrhage, hydrocephalus,
extra-axial collection or mass lesion/mass effect.

Vascular: No hyperdense vessel or unexpected calcification.

Skull: Normal. Negative for fracture or focal lesion. Mild right
frontal scalp swelling is noted.

Sinuses/Orbits: No acute finding.

Other: None.
IMPRESSION: 1. No acute intracranial abnormality.
2. Mild right frontal scalp swelling without evidence of a displaced
fracture.
